# Patient Record
Sex: Male | Born: 1975 | Race: White | Hispanic: No | Marital: Married | Smoking: Light tobacco smoker
Health system: Southern US, Community
[De-identification: ages and names within clinical notes are randomized; demographics above are authoritative.]

## PROBLEM LIST (undated history)

## (undated) DIAGNOSIS — Z91018 Allergy to other foods: Secondary | ICD-10-CM

## (undated) DIAGNOSIS — J309 Allergic rhinitis, unspecified: Secondary | ICD-10-CM

## (undated) DIAGNOSIS — T7840XA Allergy, unspecified, initial encounter: Secondary | ICD-10-CM

## (undated) DIAGNOSIS — T63441A Toxic effect of venom of bees, accidental (unintentional), initial encounter: Secondary | ICD-10-CM

## (undated) HISTORY — DX: Allergy to other foods: Z91.018

## (undated) HISTORY — DX: Toxic effect of venom of bees, accidental (unintentional), initial encounter: T63.441A

## (undated) HISTORY — DX: Allergic rhinitis, unspecified: J30.9

## (undated) HISTORY — DX: Allergy, unspecified, initial encounter: T78.40XA

---
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
## 1999-04-17 ENCOUNTER — Emergency Department (HOSPITAL_COMMUNITY): Admission: EM | Admit: 1999-04-17 | Discharge: 1999-04-17 | Payer: Self-pay | Admitting: Emergency Medicine

## 1999-07-21 ENCOUNTER — Emergency Department (HOSPITAL_COMMUNITY): Admission: EM | Admit: 1999-07-21 | Discharge: 1999-07-21 | Payer: Self-pay | Admitting: Emergency Medicine

## 2001-11-21 ENCOUNTER — Emergency Department (HOSPITAL_COMMUNITY): Admission: EM | Admit: 2001-11-21 | Discharge: 2001-11-21 | Payer: Self-pay | Admitting: Emergency Medicine

## 2001-11-21 ENCOUNTER — Encounter: Payer: Self-pay | Admitting: Emergency Medicine

## 2007-05-20 ENCOUNTER — Encounter: Admission: RE | Admit: 2007-05-20 | Discharge: 2007-05-20 | Payer: Self-pay | Admitting: Internal Medicine

## 2011-03-24 ENCOUNTER — Emergency Department (HOSPITAL_COMMUNITY)
Admission: EM | Admit: 2011-03-24 | Discharge: 2011-03-24 | Disposition: A | Discharge status: Home / Self Care | Payer: Worker's Compensation | Attending: Emergency Medicine | Admitting: Emergency Medicine

## 2011-03-24 DIAGNOSIS — IMO0002 Reserved for concepts with insufficient information to code with codable children: Secondary | ICD-10-CM | POA: Insufficient documentation

## 2011-03-24 DIAGNOSIS — M79609 Pain in unspecified limb: Secondary | ICD-10-CM | POA: Insufficient documentation

## 2011-03-24 DIAGNOSIS — X500XXA Overexertion from strenuous movement or load, initial encounter: Secondary | ICD-10-CM | POA: Insufficient documentation

## 2011-03-24 DIAGNOSIS — M2560 Stiffness of unspecified joint, not elsewhere classified: Secondary | ICD-10-CM | POA: Insufficient documentation

## 2012-04-24 ENCOUNTER — Encounter (HOSPITAL_COMMUNITY): Payer: Self-pay | Admitting: *Deleted

## 2012-04-24 ENCOUNTER — Emergency Department (HOSPITAL_COMMUNITY): Payer: Worker's Compensation

## 2012-04-24 ENCOUNTER — Emergency Department (HOSPITAL_COMMUNITY)
Admission: EM | Admit: 2012-04-24 | Discharge: 2012-04-24 | Disposition: A | Discharge status: Home / Self Care | Payer: Worker's Compensation | Attending: Emergency Medicine | Admitting: Emergency Medicine

## 2012-04-24 DIAGNOSIS — M542 Cervicalgia: Secondary | ICD-10-CM | POA: Insufficient documentation

## 2012-04-24 DIAGNOSIS — S139XXA Sprain of joints and ligaments of unspecified parts of neck, initial encounter: Secondary | ICD-10-CM | POA: Insufficient documentation

## 2012-04-24 DIAGNOSIS — S161XXA Strain of muscle, fascia and tendon at neck level, initial encounter: Secondary | ICD-10-CM

## 2012-04-24 MED ORDER — CYCLOBENZAPRINE HCL 10 MG PO TABS: 10.0000 mg | ORAL_TABLET | Freq: Two times a day (BID) | ORAL | Status: AC | PRN

## 2012-04-24 MED ORDER — MELOXICAM 15 MG PO TABS: 15.0000 mg | ORAL_TABLET | Freq: Every day | ORAL | Status: AC

## 2012-04-24 NOTE — ED Provider Notes (Signed)
Medical screening examination/treatment/procedure(s) were performed by non-physician practitioner and as supervising physician I was immediately available for consultation/collaboration.  Doug Sou, MD 04/24/12 1640
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
## 2013-10-16 HISTORY — PX: ROTATOR CUFF REPAIR: SHX139

## 2015-04-22 DIAGNOSIS — F411 Generalized anxiety disorder: Secondary | ICD-10-CM | POA: Insufficient documentation

## 2015-04-23 DIAGNOSIS — R3129 Other microscopic hematuria: Secondary | ICD-10-CM | POA: Insufficient documentation

## 2015-04-23 DIAGNOSIS — J309 Allergic rhinitis, unspecified: Secondary | ICD-10-CM | POA: Insufficient documentation

## 2015-04-23 DIAGNOSIS — R61 Generalized hyperhidrosis: Secondary | ICD-10-CM | POA: Insufficient documentation

## 2015-04-23 DIAGNOSIS — K219 Gastro-esophageal reflux disease without esophagitis: Secondary | ICD-10-CM | POA: Insufficient documentation

## 2015-04-23 DIAGNOSIS — E785 Hyperlipidemia, unspecified: Secondary | ICD-10-CM | POA: Insufficient documentation

## 2015-04-23 DIAGNOSIS — M129 Arthropathy, unspecified: Secondary | ICD-10-CM | POA: Insufficient documentation

## 2016-03-15 DIAGNOSIS — F32A Depression, unspecified: Secondary | ICD-10-CM | POA: Insufficient documentation

## 2016-03-15 DIAGNOSIS — F329 Major depressive disorder, single episode, unspecified: Secondary | ICD-10-CM | POA: Insufficient documentation

## 2016-03-15 DIAGNOSIS — I10 Essential (primary) hypertension: Secondary | ICD-10-CM | POA: Insufficient documentation

## 2016-03-15 DIAGNOSIS — G939 Disorder of brain, unspecified: Secondary | ICD-10-CM | POA: Insufficient documentation

## 2016-03-15 DIAGNOSIS — I6782 Cerebral ischemia: Secondary | ICD-10-CM | POA: Insufficient documentation

## 2016-03-16 DIAGNOSIS — E782 Mixed hyperlipidemia: Secondary | ICD-10-CM | POA: Insufficient documentation

## 2016-03-21 DIAGNOSIS — G43109 Migraine with aura, not intractable, without status migrainosus: Secondary | ICD-10-CM | POA: Insufficient documentation

## 2016-03-24 DIAGNOSIS — T7800XA Anaphylactic reaction due to unspecified food, initial encounter: Secondary | ICD-10-CM | POA: Insufficient documentation

## 2016-03-28 DIAGNOSIS — Z7982 Long term (current) use of aspirin: Secondary | ICD-10-CM | POA: Insufficient documentation

## 2016-04-26 ENCOUNTER — Encounter: Payer: Self-pay | Admitting: Allergy and Immunology

## 2016-04-26 ENCOUNTER — Ambulatory Visit (INDEPENDENT_AMBULATORY_CARE_PROVIDER_SITE_OTHER): Payer: Commercial Managed Care - HMO | Admitting: Allergy and Immunology

## 2016-04-26 VITALS — BP 120/72 | HR 68 | Temp 98.5°F | Resp 20 | Ht 65.2 in | Wt 239.0 lb

## 2016-04-26 DIAGNOSIS — Z91038 Other insect allergy status: Secondary | ICD-10-CM

## 2016-04-26 DIAGNOSIS — T7800XD Anaphylactic reaction due to unspecified food, subsequent encounter: Secondary | ICD-10-CM

## 2016-04-26 MED ORDER — EPINEPHRINE 0.3 MG/0.3ML IJ SOAJ: INTRAMUSCULAR | Status: AC

## 2016-04-26 NOTE — Progress Notes (Signed)
New Patient Note  RE: Vincent Spence. MRN: [id number] DOB: [birth year] Date of Office Visit: 04/26/2016  Referring provider: Malka So., MD Primary care provider: Malka So., MD  Chief Complaint: Allergic Reactions   History of present illness: HPI Comments: Vincent Landmark" Smyth Montez Spence. is a 40 y.o. male presenting today for consultation of possible food allergies.  He reports that on June 50 consumed pistachios within an hour developed facial swelling, including swelling of the eyelids and lips.  He denies concomitant cardiopulmonary or GI symptoms.  On 2 occasions in the past he has experienced pharyngeal pruritus and minor facial swelling with consumption of cashews.  He has avoided cashews over the past 4 years.  Approximately 5 years ago he was stung by a flying insect, which he believes may have been a yellow jacket, and developed a large local reaction as well as pharyngeal pruritus.  He has been given a prescription for an epinephrine autoinjector, however has not filled this prescription.   Assessment and plan: Allergic reactions The patient's history strongly suggests tree nut allergy though food allergen skin tests were negative today despite a positive histamine control.  The negative predictive value of food allergen skin testing is excellent, however there is still 5% chance that the allergy exists.  Therefore, we will proceed to in vitro testing and, if negative, open graded oral challenge.  A lab order form has been provided for serum specific IgE against tree nut panel, including pistachio, and alpha-gal panel.  A prescription has been provided for epinephrine auto-injector 2 pack along with instructions for proper administration.  Until tree nut allergy has been definitively ruled out, continue meticulous avoidance of all tree nuts.  Allergy to insect stings The patient's history suggests hymenoptera venom hypersensitivity.  He will be scheduled to return  for hymenoptera venom skin testing.  Lab order has been provided for serum specific IgE against hymenoptera venom panel and tryptase level.  For now, continue careful avoidance of stinging insects and have access to epinephrine autoinjector 2 pack.   Diagnositics: Food allergen skin testing:  Negative despite a positive histamine control.    Physical examination: Blood pressure 120/72, pulse 68, temperature 98.5 F (36.9 C), temperature source Oral, resp. rate 20, height 5' 5.2" (1.656 m), weight 239 lb (108.41 kg).  General: Alert, interactive, in no acute distress. Lungs: Clear to auscultation without wheezing, rhonchi or rales. CV: Normal S1, S2 without murmurs. Abdomen: Nondistended, nontender. Skin: Warm and dry, without lesions or rashes. Extremities:  No clubbing, cyanosis or edema. Neuro:   Grossly intact.  Review of systems:  Review of Systems  Constitutional: Negative for fever, chills and weight loss.  HENT: Negative for nosebleeds.   Eyes: Negative for blurred vision.  Respiratory: Negative for hemoptysis.   Cardiovascular: Negative for chest pain.  Gastrointestinal: Negative for diarrhea and constipation.  Genitourinary: Negative for dysuria.  Musculoskeletal: Negative for myalgias and joint pain.  Neurological: Negative for dizziness.  Endo/Heme/Allergies: Does not bruise/bleed easily.    Past medical history:  Past Medical History  Diagnosis Date  . Food allergy     TREE NUTS  . Toxic effect of venom of bees   . Allergic rhinitis     Past surgical history:  Past Surgical History  Procedure Laterality Date  . Rotator cuff repair Left 2015  . Nasal septum surgery  1995    Family history: Family History  Problem Relation Age of Onset  . Allergic rhinitis Sister   .  Allergic rhinitis Brother   . Angioedema Neg Hx   . Asthma Neg Hx   . Eczema Neg Hx   . Immunodeficiency Neg Hx   . Urticaria Neg Hx     Social history: Social History    Social History  . Marital Status: Married    Spouse Name: N/A  . Number of Children: N/A  . Years of Education: N/A   Occupational History  . Not on file.   Social History Main Topics  . Smoking status: Light Tobacco Smoker    Types: Cigars  . Smokeless tobacco: Never Used  . Alcohol Use: No  . Drug Use: No  . Sexual Activity: Yes   Other Topics Concern  . Not on file   Social History Narrative   Environmental History: The patient lives in a 41 year old house with carpeting throughout, gas heat, and central air.  There are 2 dogs in house which have access to his bedroom.  He is a nonsmoker.    Medication List       This list is accurate as of: 04/26/16  5:31 PM.  Always use your most recent med list.               EPINEPHrine 0.3 mg/0.3 mL Soaj injection  Commonly known as:  EPIPEN 2-PAK  USE AS DIRECTED FOR SEVERE ALLERGIC REACTION.     GOODSENSE ASPIRIN 81 MG chewable tablet  Generic drug:  aspirin  Chew 81 mg by mouth.     LEXAPRO 20 MG tablet  Generic drug:  escitalopram  Take 20 mg by mouth.     LIPITOR 20 MG tablet  Generic drug:  atorvastatin  Take 20 mg by mouth.        Known medication allergies: Allergies  Allergen Reactions  . Pistachio Nut (Diagnostic) Swelling  . Bee Venom Swelling  . Cashew Nut Oil     And pistachios.  Eyes/nose swells.  Throat tight with cashews only  . No Known Allergies     I appreciate the opportunity to take part in [name]'s care. Please do not hesitate to contact me with questions.  Sincerely,   R. Jorene Guestarter Tunis Gentle, MD

## 2016-04-26 NOTE — Assessment & Plan Note (Addendum)
The patient's history suggests hymenoptera venom hypersensitivity.  He will be scheduled to return for hymenoptera venom skin testing.  Lab order has been provided for serum specific IgE against hymenoptera venom panel and tryptase level.  For now, continue careful avoidance of stinging insects and have access to epinephrine autoinjector 2 pack.

## 2016-04-26 NOTE — Assessment & Plan Note (Addendum)
The patient's history strongly suggests tree nut allergy though food allergen skin tests were negative today despite a positive histamine control.  The negative predictive value of food allergen skin testing is excellent, however there is still 5% chance that the allergy exists.  Therefore, we will proceed to in vitro testing and, if negative, open graded oral challenge.  A lab order form has been provided for serum specific IgE against tree nut panel, including pistachio, and alpha-gal panel.  A prescription has been provided for epinephrine auto-injector 2 pack along with instructions for proper administration.  Until tree nut allergy has been definitively ruled out, continue meticulous avoidance of all tree nuts.

## 2016-04-26 NOTE — Patient Instructions (Addendum)
Allergic reactions The patient's history strongly suggests tree nut allergy though food allergen skin tests were negative today despite a positive histamine control.  The negative predictive value of food allergen skin testing is excellent, however there is still 5% chance that the allergy exists.  Therefore, we will proceed to in vitro testing and, if negative, open graded oral challenge.  A lab order form has been provided for serum specific IgE against tree nut panel, including pistachio, and alpha-gal panel.  A prescription has been provided for epinephrine auto-injector 2 pack along with instructions for proper administration.  Until tree nut allergy has been definitively ruled out, continue meticulous avoidance of all tree nuts.  Allergy to insect stings The patient's history suggests hymenoptera venom hypersensitivity.  He will be scheduled to return for hymenoptera venom skin testing.  Lab order has been provided for serum specific IgE against hymenoptera venom panel and tryptase level.  For now, continue careful avoidance of stinging insects and have access to epinephrine autoinjector 2 pack.    When lab results have returned the patient will be called with further recommendations and follow up instructions.

## 2017-11-12 ENCOUNTER — Encounter: Payer: Self-pay | Admitting: Internal Medicine

## 2018-01-08 ENCOUNTER — Ambulatory Visit: Payer: 59 | Admitting: Internal Medicine

## 2018-01-08 ENCOUNTER — Encounter: Payer: Self-pay | Admitting: Internal Medicine

## 2018-01-08 VITALS — BP 114/76 | HR 72 | Ht 65.0 in | Wt 239.0 lb

## 2018-01-08 DIAGNOSIS — Z800 Family history of malignant neoplasm of digestive organs: Secondary | ICD-10-CM | POA: Diagnosis not present

## 2018-01-08 DIAGNOSIS — Z8371 Family history of colonic polyps: Secondary | ICD-10-CM | POA: Diagnosis not present

## 2018-01-08 MED ORDER — PEG-KCL-NACL-NASULF-NA ASC-C 140 G PO SOLR: 1.0000 | Freq: Once | ORAL | 0 refills | Status: AC
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
## 2018-01-08 NOTE — Patient Instructions (Signed)

## 2018-02-25 ENCOUNTER — Encounter: Payer: Self-pay | Admitting: Internal Medicine

## 2018-04-12 ENCOUNTER — Telehealth: Payer: Self-pay | Admitting: Internal Medicine

## 2018-04-12 MED ORDER — PEG-KCL-NACL-NASULF-NA ASC-C 140 G PO SOLR: 1.0000 | Freq: Once | ORAL | 0 refills | Status: AC

## 2018-04-12 NOTE — Telephone Encounter (Signed)
Sent Plenvu to pharmacy 

## 2018-04-15 ENCOUNTER — Ambulatory Visit (AMBULATORY_SURGERY_CENTER): Payer: 59 | Admitting: Internal Medicine

## 2018-04-15 ENCOUNTER — Other Ambulatory Visit: Payer: Self-pay

## 2018-04-15 ENCOUNTER — Encounter: Payer: Self-pay | Admitting: Internal Medicine

## 2018-04-15 VITALS — BP 122/87 | HR 75 | Temp 98.0°F | Resp 18 | Ht 65.0 in | Wt 239.0 lb

## 2018-04-15 DIAGNOSIS — Z800 Family history of malignant neoplasm of digestive organs: Secondary | ICD-10-CM

## 2018-04-15 DIAGNOSIS — Z1211 Encounter for screening for malignant neoplasm of colon: Secondary | ICD-10-CM

## 2018-04-15 DIAGNOSIS — Z8371 Family history of colonic polyps: Secondary | ICD-10-CM | POA: Diagnosis not present

## 2018-04-15 DIAGNOSIS — Z83719 Family history of colon polyps, unspecified: Secondary | ICD-10-CM

## 2018-04-15 MED ORDER — SODIUM CHLORIDE 0.9 % IV SOLN: 500.0000 mL | Freq: Once | INTRAVENOUS | Status: AC

## 2018-04-15 NOTE — Op Note (Signed)
[facility] Patient Name: Vincent Spence Procedure Date: 04/15/2018 1:18 PM MRN: [id number] Endoscopist: Wilhemina BonitoJohn N. Marina GoodellPerry , MD Age: [age] Referring MD:  Date of Birth: Sep 05, 1976 Gender: Male Account #: [id number] Procedure:                Colonoscopy Indications:              Colon cancer screening in patient at increased                            risk: Family history of 1st-degree relative with                            colon polyps before age 43 years, Colon cancer                            screening in patient at increased risk: Family                            history of colorectal cancer in multiple 2nd degree                            relatives Medicines:                Monitored Anesthesia Care Procedure:                Pre-Anesthesia Assessment:                           - Prior to the procedure, a History and Physical                            was performed, and patient medications and                            allergies were reviewed. The patient's tolerance of                            previous anesthesia was also reviewed. The risks                            and benefits of the procedure and the sedation                            options and risks were discussed with the patient.                            All questions were answered, and informed consent                            was obtained. Prior Anticoagulants: The patient has                            taken no previous anticoagulant or antiplatelet  agents. ASA Grade Assessment: II - A patient with                            mild systemic disease. After reviewing the risks                            and benefits, the patient was deemed in                            satisfactory condition to undergo the procedure.                           After obtaining informed consent, the colonoscope                            was passed under direct vision. Throughout the                procedure, the patient's blood pressure, pulse, and                            oxygen saturations were monitored continuously. The                            Colonoscope was introduced through the anus and                            advanced to the the cecum, identified by                            appendiceal orifice and ileocecal valve. The                            terminal ileum, ileocecal valve, appendiceal                            orifice, and rectum were photographed. The quality                            of the bowel preparation was excellent. The                            colonoscopy was performed without difficulty. The                            patient tolerated the procedure well. The bowel                            preparation used was SUPREP. Scope In: 1:51:06 PM Scope Out: 2:03:32 PM Scope Withdrawal Time: 0 hours 10 minutes 32 seconds  Total Procedure Duration: 0 hours 12 minutes 26 seconds  Findings:                 The terminal ileum appeared normal.  The exam was otherwise without abnormality on                            direct and retroflexion views. Complications:            No immediate complications. Estimated blood loss:                            None. Estimated Blood Loss:     Estimated blood loss: none. Impression:               - The examined portion of the ileum was normal.                           - The examination was otherwise normal on direct                            and retroflexion views.                           - No specimens collected. Recommendation:           - Repeat colonoscopy in 7 years for screening                            purposes (age 39).                           - Patient has a contact number available for                            emergencies. The signs and symptoms of potential                            delayed complications were discussed with the                             patient. Return to normal activities tomorrow.                            Written discharge instructions were provided to the                            patient.                           - Resume previous diet.                           - Continue present medications. Wilhemina Bonito. Marina Goodell, MD 04/15/2018 2:08:12 PM This report has been signed electronically.

## 2018-04-15 NOTE — Progress Notes (Signed)
Report to PACU, RN, vss, BBS= Clear.  

## 2018-04-15 NOTE — Patient Instructions (Signed)
Repeat colonoscopy in 7 years.   YOU HAD AN ENDOSCOPIC PROCEDURE TODAY AT THE [facility]:   Refer to the procedure report that was given to you for any specific questions about what was found during the examination.  If the procedure report does not answer your questions, please call your gastroenterologist to clarify.  If you requested that your care partner not be given the details of your procedure findings, then the procedure report has been included in a sealed envelope for you to review at your convenience later.  YOU SHOULD EXPECT: Some feelings of bloating in the abdomen. Passage of more gas than usual.  Walking can help get rid of the air that was put into your GI tract during the procedure and reduce the bloating. If you had a lower endoscopy (such as a colonoscopy or flexible sigmoidoscopy) you may notice spotting of blood in your stool or on the toilet paper. If you underwent a bowel prep for your procedure, you may not have a normal bowel movement for a few days.  Please Note:  You might notice some irritation and congestion in your nose or some drainage.  This is from the oxygen used during your procedure.  There is no need for concern and it should clear up in a day or so.  SYMPTOMS TO REPORT IMMEDIATELY:   Following lower endoscopy (colonoscopy or flexible sigmoidoscopy):  Excessive amounts of blood in the stool  Significant tenderness or worsening of abdominal pains  Swelling of the abdomen that is new, acute  Fever of 100F or higher   For urgent or emergent issues, a gastroenterologist can be reached at any hour by calling [phone].   DIET:  We do recommend a small meal at first, but then you may proceed to your regular diet.  Drink plenty of fluids but you should avoid alcoholic beverages for 24 hours.  ACTIVITY:  You should plan to take it easy for the rest of today and you should NOT DRIVE or use heavy machinery until tomorrow (because of the sedation  medicines used during the test).    FOLLOW UP: Our staff will call the number listed on your records the next business day following your procedure to check on you and address any questions or concerns that you may have regarding the information given to you following your procedure. If we do not reach you, we will leave a message.  However, if you are feeling well and you are not experiencing any problems, there is no need to return our call.  We will assume that you have returned to your regular daily activities without incident.  If any biopsies were taken you will be contacted by phone or by letter within the next 1-3 weeks.  Please call us at [phone] if you have not heard about the biopsies in 3 weeks.    SIGNATURES/CONFIDENTIALITY: You and/or your care partner have signed paperwork which will be entered into your electronic medical record.  These signatures attest to the fact that that the information above on your After Visit Summary has been reviewed and is understood.  Full responsibility of the confidentiality of this discharge information lies with you and/or your care-partner.

## 2018-04-16 ENCOUNTER — Telehealth: Payer: Self-pay

## 2018-04-16 NOTE — Telephone Encounter (Signed)
  Follow up Call-  Call Matej Sappenfield number 04/15/2018  Post procedure Call Jaylan Hinojosa phone  # [phone]  Permission to leave phone message Yes  Some recent data might be hidden     Patient questions:  Do you have a fever, pain , or abdominal swelling? No. Pain Score  0 *  Have you tolerated food without any problems? Yes.    Have you been able to return to your normal activities? Yes.    Do you have any questions about your discharge instructions: Diet   No. Medications  No. Follow up visit  No.  Do you have questions or concerns about your Care? No.  Actions: * If pain score is 4 or above: No action needed, pain <4.

## 2022-02-22 ENCOUNTER — Emergency Department (HOSPITAL_COMMUNITY): Payer: No Typology Code available for payment source

## 2022-02-22 ENCOUNTER — Emergency Department (HOSPITAL_COMMUNITY)
Admission: EM | Admit: 2022-02-22 | Discharge: 2022-02-22 | Disposition: A | Discharge status: Home / Self Care | Payer: No Typology Code available for payment source | Attending: Emergency Medicine | Admitting: Emergency Medicine

## 2022-02-22 ENCOUNTER — Other Ambulatory Visit: Payer: Self-pay

## 2022-02-22 ENCOUNTER — Encounter (HOSPITAL_COMMUNITY): Payer: Self-pay | Admitting: Emergency Medicine

## 2022-02-22 DIAGNOSIS — R109 Unspecified abdominal pain: Secondary | ICD-10-CM | POA: Insufficient documentation

## 2022-02-22 DIAGNOSIS — M545 Low back pain, unspecified: Secondary | ICD-10-CM | POA: Diagnosis present

## 2022-02-22 DIAGNOSIS — Z7982 Long term (current) use of aspirin: Secondary | ICD-10-CM | POA: Diagnosis not present

## 2022-02-22 DIAGNOSIS — Y92410 Unspecified street and highway as the place of occurrence of the external cause: Secondary | ICD-10-CM | POA: Insufficient documentation

## 2022-02-22 MED ORDER — SODIUM CHLORIDE (PF) 0.9 % IJ SOLN
INTRAMUSCULAR | Status: AC
  Filled 2022-02-22: qty 50

## 2022-02-22 MED ORDER — IOHEXOL 300 MG/ML  SOLN
100.0000 mL | Freq: Once | INTRAMUSCULAR | Status: AC | PRN
  Administered 2022-02-22: 100 mL via INTRAVENOUS
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
## 2022-02-22 NOTE — Discharge Instructions (Addendum)
Ibuprofen for soreness  
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
